# Patient Record
Sex: Male | Born: 1950 | Race: White | Hispanic: No | Marital: Married | Smoking: Former smoker
Health system: Southern US, Community
[De-identification: ages and names within clinical notes are randomized; demographics above are authoritative.]

## PROBLEM LIST (undated history)

## (undated) DIAGNOSIS — E119 Type 2 diabetes mellitus without complications: Secondary | ICD-10-CM

## (undated) DIAGNOSIS — I10 Essential (primary) hypertension: Secondary | ICD-10-CM

## (undated) DIAGNOSIS — M3130 Wegener's granulomatosis without renal involvement: Secondary | ICD-10-CM

## (undated) DIAGNOSIS — D126 Benign neoplasm of colon, unspecified: Secondary | ICD-10-CM

## (undated) HISTORY — PX: LARYNX SURGERY: SHX692

## (undated) HISTORY — PX: ANKLE FUSION: SHX881

## (undated) HISTORY — DX: Type 2 diabetes mellitus without complications: E11.9

## (undated) HISTORY — PX: CARDIAC CATHETERIZATION: SHX172

## (undated) HISTORY — DX: Essential (primary) hypertension: I10

## (undated) HISTORY — PX: MENISCUS REPAIR: SHX5179

## (undated) HISTORY — DX: Wegener's granulomatosis without renal involvement: M31.30

## (undated) HISTORY — DX: Benign neoplasm of colon, unspecified: D12.6

## (undated) HISTORY — PX: BRONCHOSCOPY: SUR163

---

## 2015-09-02 ENCOUNTER — Telehealth: Payer: Self-pay | Admitting: Internal Medicine

## 2015-09-02 NOTE — Telephone Encounter (Signed)
Rec'd from [facility] forward 111 pages to Dr.Wert

## 2015-09-11 ENCOUNTER — Encounter: Payer: Self-pay | Admitting: *Deleted

## 2015-09-12 ENCOUNTER — Ambulatory Visit (INDEPENDENT_AMBULATORY_CARE_PROVIDER_SITE_OTHER)
Admission: RE | Admit: 2015-09-12 | Discharge: 2015-09-12 | Disposition: A | Discharge status: Home / Self Care | Payer: Medicare Other | Source: Ambulatory Visit | Attending: Internal Medicine | Admitting: Internal Medicine

## 2015-09-12 ENCOUNTER — Other Ambulatory Visit (INDEPENDENT_AMBULATORY_CARE_PROVIDER_SITE_OTHER): Payer: Medicare Other

## 2015-09-12 ENCOUNTER — Encounter: Payer: Self-pay | Admitting: Internal Medicine

## 2015-09-12 ENCOUNTER — Ambulatory Visit (INDEPENDENT_AMBULATORY_CARE_PROVIDER_SITE_OTHER): Payer: Medicare Other | Admitting: Internal Medicine

## 2015-09-12 VITALS — BP 118/62 | HR 82 | Ht 72.0 in | Wt 279.6 lb

## 2015-09-12 DIAGNOSIS — M3130 Wegener's granulomatosis without renal involvement: Secondary | ICD-10-CM | POA: Diagnosis not present

## 2015-09-12 DIAGNOSIS — R0600 Dyspnea, unspecified: Secondary | ICD-10-CM

## 2015-09-12 LAB — CBC WITH DIFFERENTIAL/PLATELET
BASOS ABS: 0 10*3/uL (ref 0.0–0.1)
BASOS PCT: 0.3 % (ref 0.0–3.0)
EOS PCT: 0.2 % (ref 0.0–5.0)
Eosinophils Absolute: 0 10*3/uL (ref 0.0–0.7)
HEMATOCRIT: 33.2 % — AB (ref 39.0–52.0)
Hemoglobin: 11.2 g/dL — ABNORMAL LOW (ref 13.0–17.0)
Lymphocytes Relative: 9.7 % — ABNORMAL LOW (ref 12.0–46.0)
Lymphs Abs: 0.9 10*3/uL (ref 0.7–4.0)
MCHC: 33.6 g/dL (ref 30.0–36.0)
MCV: 94.4 fl (ref 78.0–100.0)
MONOS PCT: 9.9 % (ref 3.0–12.0)
Monocytes Absolute: 0.9 10*3/uL (ref 0.1–1.0)
NEUTROS ABS: 7 10*3/uL (ref 1.4–7.7)
NEUTROS PCT: 79.9 % — AB (ref 43.0–77.0)
PLATELETS: 321 10*3/uL (ref 150.0–400.0)
RBC: 3.52 Mil/uL — ABNORMAL LOW (ref 4.22–5.81)
RDW: 16.3 % — AB (ref 11.5–15.5)
WBC: 8.8 10*3/uL (ref 4.0–10.5)

## 2015-09-12 LAB — BASIC METABOLIC PANEL
BUN: 21 mg/dL (ref 6–23)
CHLORIDE: 104 meq/L (ref 96–112)
CO2: 29 meq/L (ref 19–32)
Calcium: 10 mg/dL (ref 8.4–10.5)
Creatinine, Ser: 1.19 mg/dL (ref 0.40–1.50)
GFR: 64.92 mL/min (ref >60.00)
Glucose, Bld: 140 mg/dL — ABNORMAL HIGH (ref 70–99)
POTASSIUM: 4.4 meq/L (ref 3.5–5.1)
Sodium: 142 mEq/L (ref 135–145)

## 2015-09-12 LAB — TSH: TSH: 0.98 u[IU]/mL (ref 0.35–4.50)

## 2015-09-12 LAB — BRAIN NATRIURETIC PEPTIDE: PRO B NATRI PEPTIDE: 244 pg/mL — AB (ref 0.0–100.0)

## 2015-09-12 LAB — SEDIMENTATION RATE: Sed Rate: 73 mm/hr — ABNORMAL HIGH (ref 0–22)

## 2015-09-12 NOTE — Patient Instructions (Addendum)
Please remember to go to the lab and x-ray department downstairs for your tests - we will call you with the results when they are available.    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

## 2015-09-12 NOTE — Progress Notes (Signed)
Subjective:     Patient ID: Dylan Dixon, male   DOB: [birth year],    MRN: [id number]  HPI   61 yowm quit 1987 presented in mid 1990s to this office with sweats / cough / sinus infections but no renal involvement > dx   with WG rx cytoxan for maybe a year got better then 2014 cough / sinus problem > eval in [place] dx by fob dx recurrent dx rx rituxan/prednisone better but felt lousy from meds and stopped after sev months then changed aziothioprine and back to baseline  then sept  2016 cough/sinus problems while on azothioprine > abn ct chest c/w recurrent dz > high dose steroids > started 60 as outpt down to 2.5 mg and 75% back to nl but requesting second opinion and referred himself back to this office 09/12/2015    09/12/2015 1st [facility] office visit/ Wert   Chief Complaint  Patient presents with  . Pulmonary Consult    Former [facility] patient last seen 1995. Pt seen at [facility]- 2nd opinion.   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 No unusual exp hx or h/o childhood pna/ asthma or knowledge of premature birth.  Sleeping ok without nocturnal  or early am exacerbation  of respiratory  c/o's or need for noct saba. Also denies any obvious fluctuation of symptoms with weather or environmental changes or other aggravating or alleviating factors except as outlined above   Current Medications, Allergies, Complete Past Medical History, Past Surgical History, Family History, and Social History were reviewed in [employer] record.  ROS  The following are not active complaints unless bolded sore throat, dysphagia, dental  problems, itching, sneezing,  nasal congestion or excess/ purulent secretions, ear ache,   fever, chills, sweats, unintended wt loss, classically pleuritic or exertional cp, hemoptysis,  orthopnea pnd or leg swelling, presyncope, palpitations, abdominal pain, anorexia, nausea, vomiting, diarrhea  or change in bowel or bladder habits, change in stools or urine, dysuria,hematuria,  rash, arthralgias, visual complaints, headache, numbness, weakness or ataxia or problems with walking or coordination,  change in mood/affect or memory.              Review of Systems     Objective:   Physical Exam    amb wm nad/ mild pseudowheeze   Wt Readings from Last 3 Encounters:  09/12/15 279 lb 9.6 oz (126.826 kg)    Vital signs reviewed   HEENT: nl dentition, turbinates, and oropharynx. Nl external ear canals without cough reflex   NECK :  without JVD/Nodes/TM/ nl carotid upstrokes bilaterally   LUNGS: no acc muscle use,  Nl contour chest which is clear to A and P bilaterally without cough on insp or exp maneuvers   CV:  RRR  no s3 or murmur or increase in P2, no edema   ABD:  soft and nontender with nl inspiratory excursion in the supine position. No bruits or organomegaly, bowel sounds nl  MS:  Nl gait/ ext warm without deformities, calf tenderness, cyanosis or clubbing No obvious joint restrictions  SKIN: warm and dry without lesions    NEURO:  alert, approp, nl sensorium with  no motor deficits    CXR PA and Lateral:   09/12/2015 :    I personally reviewed images and agree with radiology impression as follows:   Linear opacities in the lung bases suggestive of atelectasis. Low lung volumes. No other acute abnormalities.   Labs ordered 09/12/2015 / reviewed:      Chemistry      Component Value Date/Time   NA 142 09/12/2015 1617   K 4.4 09/12/2015 1617   CL 104 09/12/2015 1617   CO2 29 09/12/2015 1617   BUN 21 09/12/2015 1617   CREATININE 1.19 09/12/2015 1617       Component Value Date/Time   CALCIUM 10.0 09/12/2015 1617        Lab Results  Component Value Date   WBC 8.8 09/12/2015   HGB 11.2* 09/12/2015   HCT 33.2* 09/12/2015   MCV 94.4 09/12/2015   PLT 321.0 09/12/2015        Lab Results  Component Value Date   TSH 0.98 09/12/2015     Lab Results  Component Value Date   PROBNP 244.0* 09/12/2015       Lab Results  Component Value Date   ESRSEDRATE 73* 09/12/2015     ANCA Screen 09/16/2015   Neg     Assessment:

## 2015-09-13 DIAGNOSIS — M3130 Wegener's granulomatosis without renal involvement: Secondary | ICD-10-CM | POA: Insufficient documentation

## 2015-09-13 NOTE — Assessment & Plan Note (Addendum)
09/12/2015  desats walking x one lap - Spirometry 09/12/2015  FEV1 1.50 (40% %)  Ratio 83 with VC 37%    Clearly partly related to obesity/deconditioning and ? Fluid retention from Actos though concerned re recurrent WG (see separate a/p) - no evidence of significant airflow obstruction though does have mild pseudowheeze on exam and ideally needs serial full pfts to track issue of ILD/ large airway obst (with full f/v loops) as pts with WG tend to get bands across the airway mimicking asthma, as may have been the case previously

## 2015-09-15 LAB — ANCA SCREEN W REFLEX TITER: ANCA SCREEN: NEGATIVE

## 2015-09-15 NOTE — Assessment & Plan Note (Addendum)
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   In view of hx of > 10 year "remission" p course of ctx > this might be the best option though certainly risk of toxicity would need to be addressed/carefully monitored.  I asked our Rheumatologist Dr Charlestine Night (to whom I refer WG locally) to comment and he does feel rituxamab has good data in large studies of WG but that care needs to be individualized going forward so I think a rheumatology 2nd opinion would be the best next step here.   In terms of prednisone rx, I informed the pt: The goal with a chronic steroid dependent illness is always arriving at the lowest effective dose that controls the disease/symptoms and not accepting a set "formula" which is based on statistics or guidelines that don't always take into account patient  variability or the natural hx of the dz in every individual patient, which may well vary over time.  For now therefore I recommend the patient maintain  His present floor dose until seen by his pulmonologist or rheumatologist in [place], which ever comes first  Pulmonary f/u here is prn

## 2015-09-15 NOTE — Progress Notes (Signed)
Quick Note:  Spoke with pt and notified of results per Dr. Wert. Pt verbalized understanding and denied any questions.  ______ 

## 2015-09-22 ENCOUNTER — Telehealth: Payer: Self-pay | Admitting: Internal Medicine

## 2015-09-22 NOTE — Telephone Encounter (Signed)
Per pt's chart Magda Paganini discussed cxr results / recs with him last week, however there are still labs that need MW's recs.  Please advise, thank you.  [phone] pt cell [phone] wife cell  Call Documentation      Osa Craver, CMA at 09/16/2015 11:34 AM     Status: Signed       Expand All Collapse All    Notes Recorded by Deneise Lever, MD on 09/15/2015 at 1:36 PM CXR- stable scarring and post-operative changes. No obvious active infection seen.   Called and spoke with pt. Reviewed results and recs. Pt voiced understanding and had no further questions. Nothing further needed.

## 2015-09-22 NOTE — Telephone Encounter (Signed)
lmom re call back during office hours to discuss

## 2015-09-22 NOTE — Telephone Encounter (Signed)
ATC x 3, busy signal, wcb

## 2015-09-23 NOTE — Telephone Encounter (Signed)
Last OV note faxed to Dr. Natale Milch office.  Nothing further needed.

## 2015-09-23 NOTE — Telephone Encounter (Signed)
Discussed with pt:  1) not evidence active dz  2) Issue is what to do if breaks thru azathiorpine again  3) I favor cytoxan as it worked great in the 1990's but I prefer rheumatology to follow as more toxic than azathiorpine  4) Risk/ benefit of Rituxamab/ pred per Dr Allean Found with issue also of ? Rheum opinion per Dr Allean Found unless pt wants me to refer him to one of ours as I don't know anyone in [place] to recommend but he certainly would  5) pulmonary f/u prn  Send copy of this note and my ov to Dr Allean Found in [facility]

## 2016-07-11 IMAGING — DX DG CHEST 2V
2 series · 2 of 2 positions shown · non-contrast
Comparison: None.

CLINICAL DATA: Shortness of breath for 8 months.

EXAM:
CHEST  2 VIEW

[chest pa]
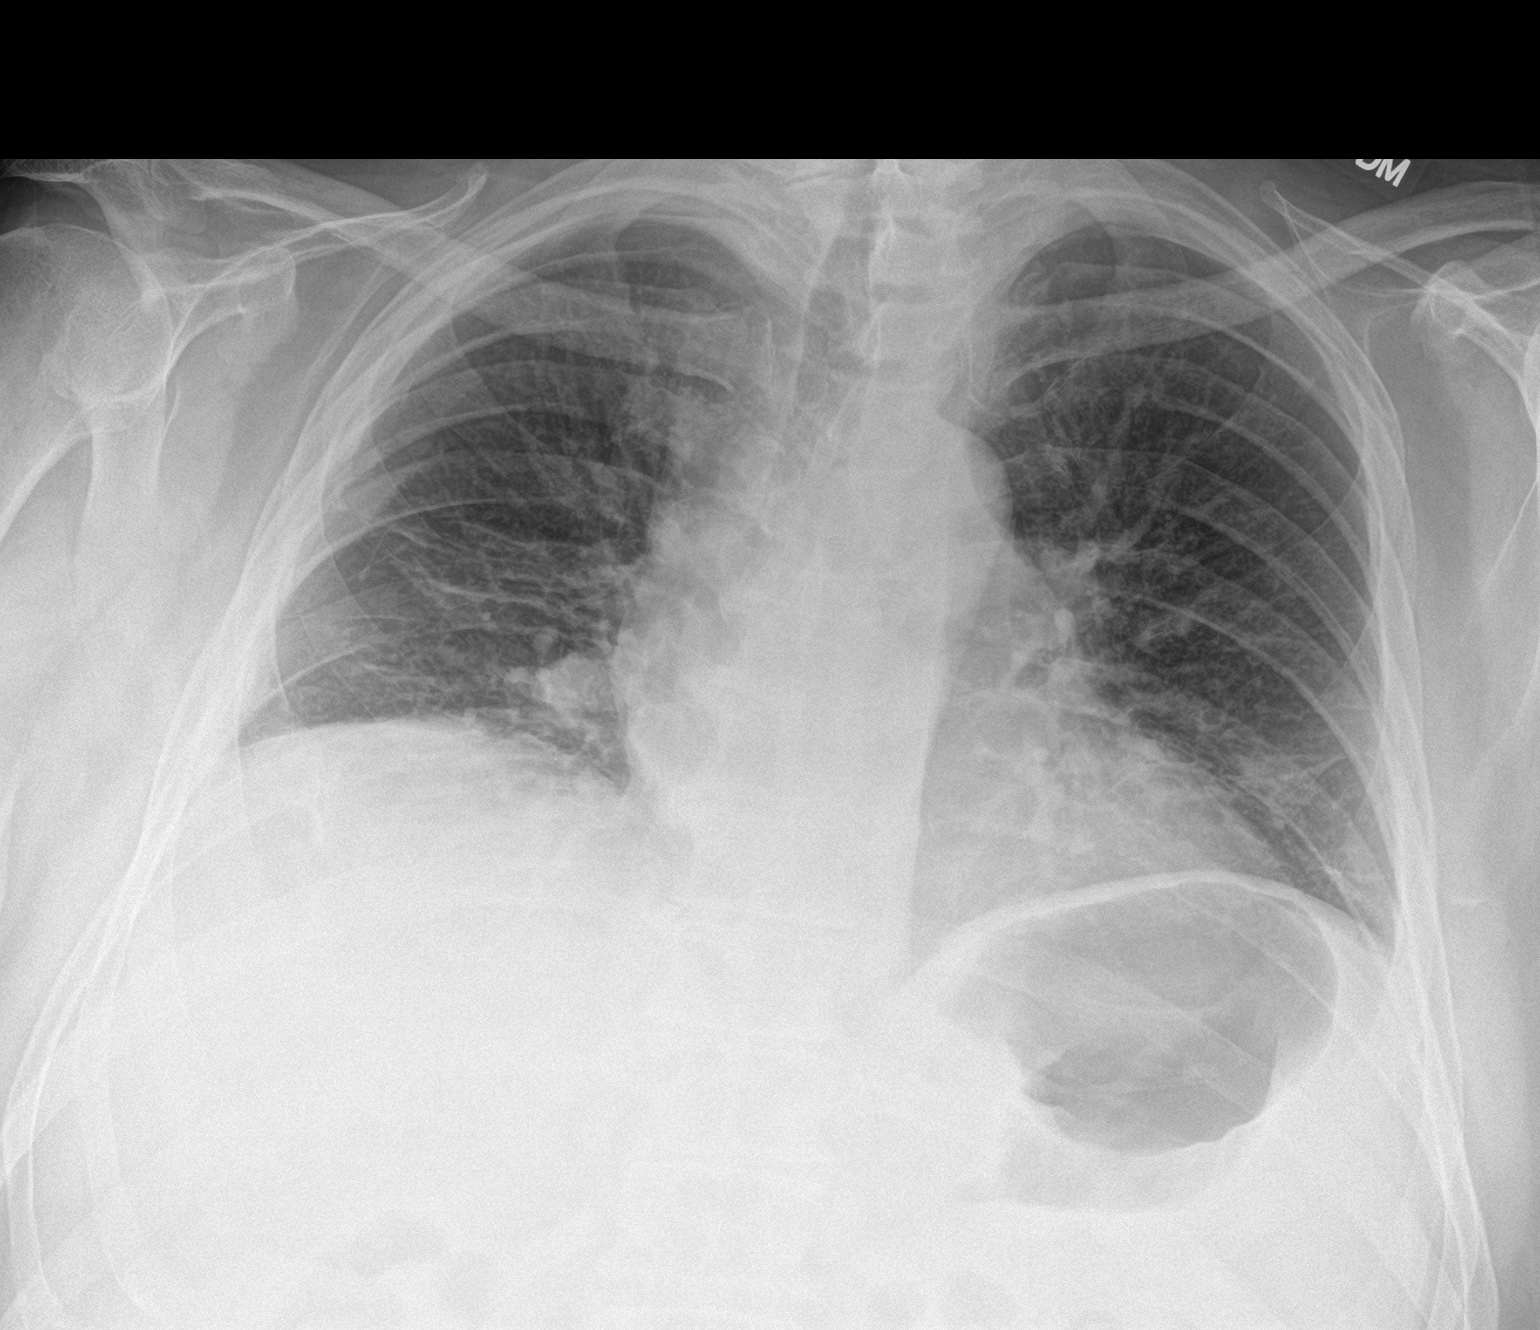

[chest lat]
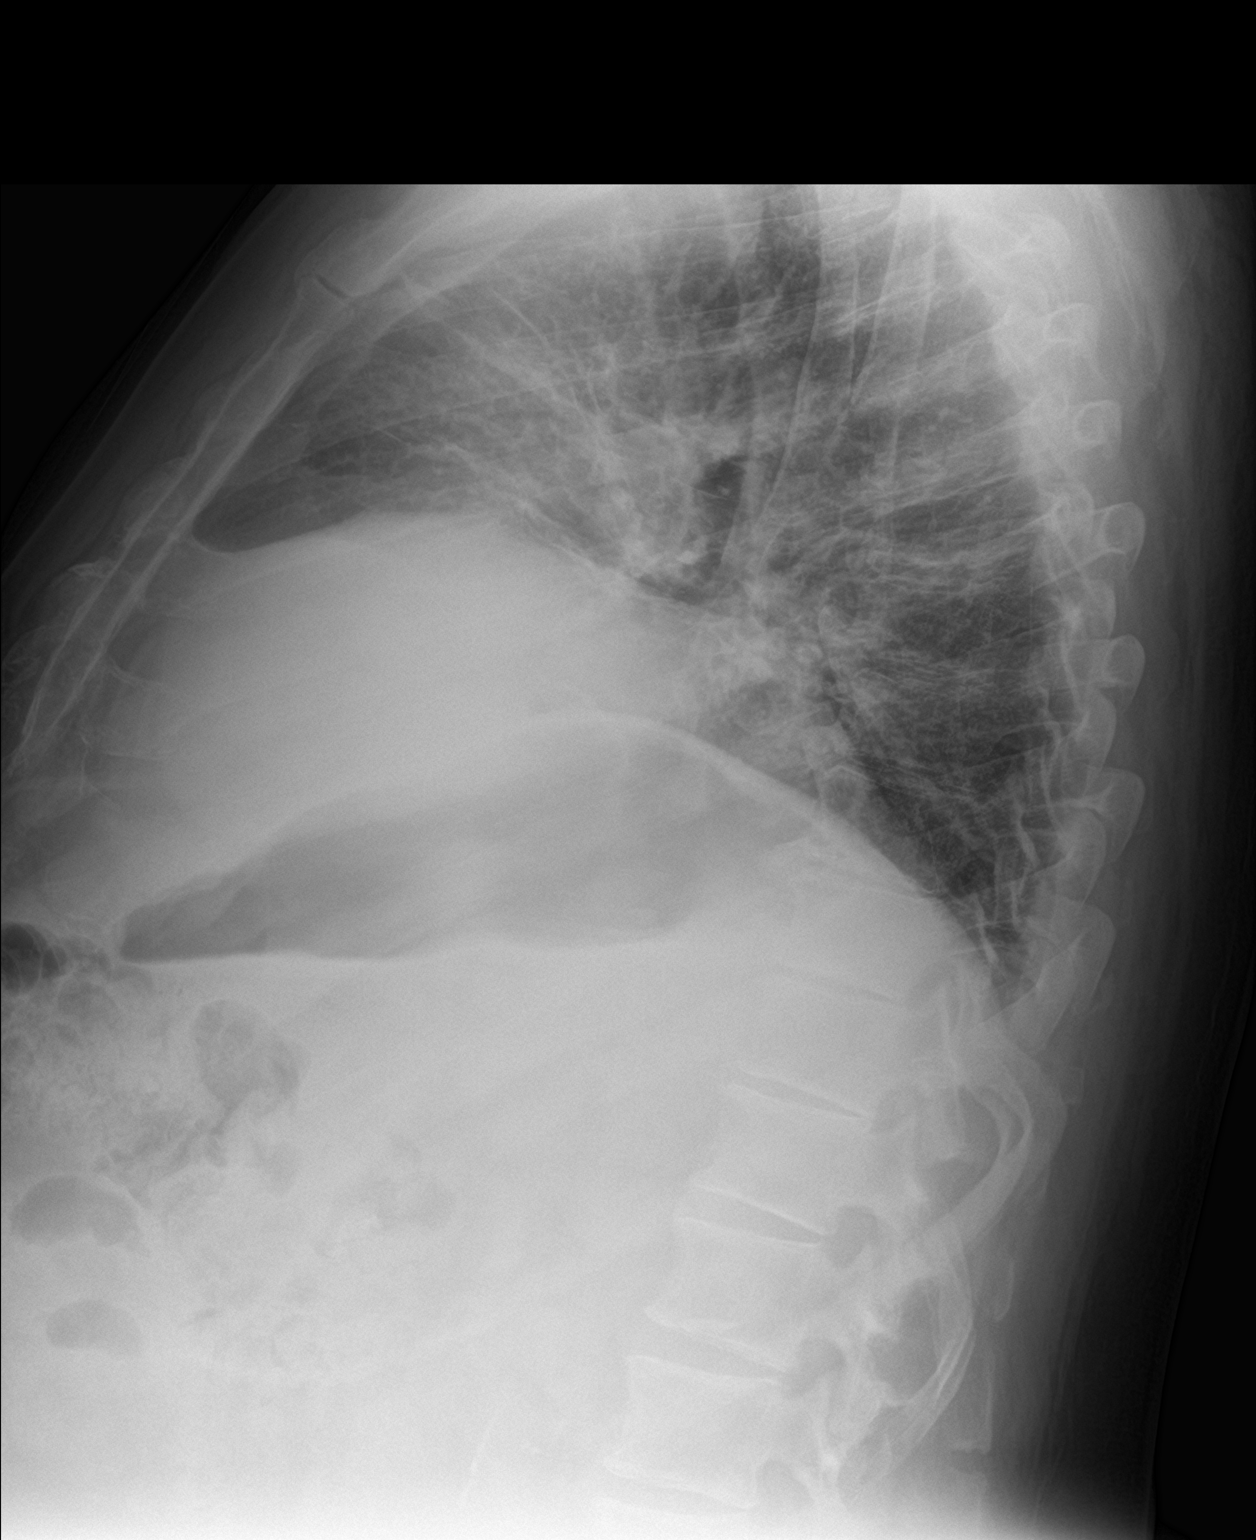

[2 of 2 positions shown; findings below may reference images not displayed]

FINDINGS: No pneumothorax. The heart, hila, and mediastinum are unremarkable.
Atelectasis is seen in the bases. There are low lung volumes. No
pulmonary nodules or masses. No focal infiltrates. Elevation of the
right hemidiaphragm is identified. No other acute abnormalities are
seen.
IMPRESSION: Linear opacities in the lung bases suggestive of atelectasis. Low
lung volumes. No other acute abnormalities.

## 2019-07-09 DEATH — deceased
